# Patient Record
Sex: Male | Born: 1995 | Race: White | Hispanic: No | Marital: Single | Smoking: Current every day smoker
Health system: Southern US, Community
[De-identification: ages and names within clinical notes are randomized; demographics above are authoritative.]

## PROBLEM LIST (undated history)

## (undated) DIAGNOSIS — S99912A Unspecified injury of left ankle, initial encounter: Secondary | ICD-10-CM

## (undated) DIAGNOSIS — F1990 Other psychoactive substance use, unspecified, uncomplicated: Secondary | ICD-10-CM

---
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
## 2017-05-23 ENCOUNTER — Emergency Department
Admission: EM | Admit: 2017-05-23 | Discharge: 2017-05-23 | Disposition: A | Discharge status: Home / Self Care | Payer: Self-pay | Attending: Emergency Medicine | Admitting: Emergency Medicine

## 2017-05-23 ENCOUNTER — Other Ambulatory Visit: Payer: Self-pay

## 2017-05-23 DIAGNOSIS — K0889 Other specified disorders of teeth and supporting structures: Secondary | ICD-10-CM | POA: Insufficient documentation

## 2017-05-23 MED ORDER — TRAMADOL HCL 50 MG PO TABS: 50.0000 mg | ORAL_TABLET | Freq: Four times a day (QID) | ORAL | 0 refills | Status: DC | PRN

## 2017-05-23 MED ORDER — LIDOCAINE VISCOUS 2 % MT SOLN
15.0000 mL | Freq: Once | OROMUCOSAL | Status: AC
  Administered 2017-05-23: 15 mL via OROMUCOSAL
  Filled 2017-05-23: qty 15

## 2017-05-23 MED ORDER — AMOXICILLIN 500 MG PO CAPS: 500.0000 mg | ORAL_CAPSULE | Freq: Three times a day (TID) | ORAL | 0 refills | Status: DC

## 2017-05-23 MED ORDER — IBUPROFEN 800 MG PO TABS: 800.0000 mg | ORAL_TABLET | Freq: Three times a day (TID) | ORAL | 0 refills | Status: DC | PRN

## 2017-05-23 MED ORDER — DIPHENHYDRAMINE HCL 12.5 MG/5ML PO ELIX
25.0000 mg | ORAL_SOLUTION | Freq: Once | ORAL | Status: AC
  Administered 2017-05-23: 25 mg via ORAL
  Filled 2017-05-23: qty 10

## 2017-05-23 MED ORDER — LIDOCAINE VISCOUS 2 % MT SOLN: 5.0000 mL | Freq: Four times a day (QID) | OROMUCOSAL | 0 refills | Status: DC | PRN

## 2017-05-23 NOTE — ED Provider Notes (Signed)
[facility] Emergency Department Provider Note   ____________________________________________   First MD Initiated Contact with Patient 05/23/17 [phone]     (approximate)  I have reviewed the triage vital signs and the nursing notes.   HISTORY  Chief Complaint Dental Pain    HPI Jeffrey Patel is a 21 y.o. male patient presented with left-sided dental pain for 3 days. Patient has history of devitalized teeth. Patient has no dental follow-up in over 2 years. Patient rates pain as a 9/10. Patient describes pain as "achy". Patient denies fever associated this complaint. Patient denies  edema. No palliative measures for complaint.   Past Medical History:  Diagnosis Date  . Left ankle injury     There are no active problems to display for this patient.   No past surgical history on file.  Prior to Admission medications   Medication Sig Start Date End Date Taking? Authorizing Provider  amoxicillin (AMOXIL) 500 MG capsule Take 1 capsule (500 mg total) by mouth 3 (three) times daily. 05/23/17   Joni ReiningSmith, Ronald K, PA-C  ibuprofen (ADVIL,MOTRIN) 800 MG tablet Take 1 tablet (800 mg total) by mouth every 8 (eight) hours as needed. 05/23/17   Joni ReiningSmith, Ronald K, PA-C  lidocaine (XYLOCAINE) 2 % solution Use as directed 5 mLs in the mouth or throat every 6 (six) hours as needed for mouth pain. Oral swish 05/23/17   Joni ReiningSmith, Ronald K, PA-C  traMADol (ULTRAM) 50 MG tablet Take 1 tablet (50 mg total) by mouth every 6 (six) hours as needed. 05/23/17 05/23/18  Joni ReiningSmith, Ronald K, PA-C    Allergies Patient has no known allergies.  No family history on file.  Social History Social History   Tobacco Use  . Smoking status: Not on file  Substance Use Topics  . Alcohol use: Not on file  . Drug use: Not on file    Review of Systems  Constitutional: No fever/chills Eyes: No visual changes. ENT: No sore throat. Cardiovascular: Denies chest pain. Respiratory: Denies  shortness of breath. Gastrointestinal: No abdominal pain.  No nausea, no vomiting.  No diarrhea.  No constipation. Genitourinary: Negative for dysuria. Musculoskeletal: Negative for back pain. Skin: Negative for rash. Neurological: Negative for headaches, focal weakness or numbness.  ____________________________________________   PHYSICAL EXAM:  VITAL SIGNS: ED Triage Vitals  Enc Vitals Group     BP 05/23/17 0855 (!) 127/54     Pulse Rate 05/23/17 0855 64     Resp --      Temp 05/23/17 0855 (!) 97.5 F (36.4 C)     Temp Source 05/23/17 0855 Oral     SpO2 05/23/17 0855 100 %     Weight 05/23/17 0855 160 lb (72.6 kg)     Height 05/23/17 0855 5\' 11"  (1.803 m)     Head Circumference --      Peak Flow --      Pain Score 05/23/17 0851 9     Pain Loc --      Pain Edu? --      Excl. in GC? --    Constitutional: Alert and oriented. Well appearing and in no acute distress. Eyes: Conjunctivae are normal. PERRL. Mouth/Throat: Mucous membranes are moist.  Oropharynx non-erythematous. Multiple fracture and caries throughout oral cavity. Neck: No stridor.  Hematological/Lymphatic/Immunilogical: No cervical lymphadenopathy. Cardiovascular: Normal rate, regular rhythm. Grossly normal heart sounds.  Good peripheral circulation. Respiratory: Normal respiratory effort.  No retractions. Lungs CTAB. Neurologic:  Normal speech and language. No gross  focal neurologic deficits are appreciated. No gait instability. Skin:  Skin is warm, dry and intact. No rash noted. Psychiatric: Mood and affect are normal. Speech and behavior are normal.  ____________________________________________   LABS (all labs ordered are listed, but only abnormal results are displayed)  Labs Reviewed - No data to display ____________________________________________  EKG   ____________________________________________  RADIOLOGY  No results  found.  ____________________________________________   PROCEDURES  Procedure(s) performed: None  Procedures  Critical Care performed: No  ____________________________________________   INITIAL IMPRESSION / ASSESSMENT AND PLAN / ED COURSE  As part of my medical decision making, I reviewed the following data within the electronic MEDICAL RECORD NUMBER Notes from prior ED visits and [place] Controlled Substance Database   Dental pain secondary to multiple fracture teeth and caries. Patient given discharge care instructions. Patient has taken medication as directed. Patient advised follow-up for list of dental clinics provided before discharge.      ____________________________________________   FINAL CLINICAL IMPRESSION(S) / ED DIAGNOSES  Final diagnoses:  Dentalgia     ED Discharge Orders        Ordered    amoxicillin (AMOXIL) 500 MG capsule  3 times daily     05/23/17 0952    lidocaine (XYLOCAINE) 2 % solution  Every 6 hours PRN     05/23/17 0952    traMADol (ULTRAM) 50 MG tablet  Every 6 hours PRN     05/23/17 0952    ibuprofen (ADVIL,MOTRIN) 800 MG tablet  Every 8 hours PRN     05/23/17 [id number]       Note:  This document was prepared using Dragon voice recognition software and may include unintentional dictation errors.    Joni ReiningSmith, Ronald K, PA-C 05/23/17 1002    Jene EveryKinner, Robert, MD 05/23/17 [phone]

## 2017-05-23 NOTE — ED Notes (Signed)
See triage note Presents with  Toothache for about 3 days  States he thinks he may have broken a tooth  Min jaw swelling noted

## 2017-05-23 NOTE — Discharge Instructions (Signed)
Follow-up from list of dental clinics. °OPTIONS FOR DENTAL FOLLOW UP CARE ° °[place] [facility] and Human [facility] - Local Safety Net Dental Clinics °http://www.ncdhhs.gov/dph/oralhealth/services/safetynetclinics.htm °  °[facility] (336-562-3123) ° °[facility] (919-933-9087) ° °[facility] ([phone] ext [phone]) ° °[facility] ([phone]) ° °[facility] ([phone]) °This clinic caters to the indigent population and is on a lottery system. °Location: °[facility], [address], [place] °Clinic Hours: °Wednesdays from 6pm - 9pm, patients seen by a lottery system. °For dates, call or go to [email or web address].[email or web address]/[facility]/patients/Dental-[facility] °Services: °Cleanings, fillings and simple extractions. °Payment Options: °DENTAL WORK IS FREE OF CHARGE. Bring proof of income or support. °Best way to get seen: °Arrive at 5:15 pm - this is a lottery, NOT first come/first serve, so arriving earlier will not increase your chances of being seen. °  °  °[facility] °[phone] °Select option 1 for emergencies °  °Location: °[facility], [address], [place] °Clinic Hours: °No walk-ins accepted - call the day before to schedule an appointment. °Check in times are 9:30 am and 1:30 pm. °Services: °Simple extractions, temporary fillings, pulpectomy/pulp debridement, uncomplicated abscess drainage. °Payment Options: °PAYMENT IS DUE AT THE TIME OF SERVICE.  Fee is usually $100-200, additional surgical procedures (e.g. abscess drainage) may be extra. °Cash, checks, Visa/MasterCard accepted.  Can file Medicaid if patient is covered for dental - patient should call case worker to check. °No discount for [facility] patients. °Best way to get seen: °MUST call the day before and get onto the schedule. Can usually be seen the next 1-2 days. No walk-ins accepted. °  °  °[facility] °[phone] °  °Location: °[facility], [address], [place] °Clinic Hours: °M, W, Th, F 8am or 1:30pm, Tues 9a or 1:30 - first come/first served. °Services: °Simple extractions, temporary fillings, uncomplicated abscess drainage.  You do not need to be an [place] resident. °Payment Options: °PAYMENT IS DUE AT THE TIME OF SERVICE. °Dental insurance, otherwise sliding scale - bring proof of income or support. °Depending on income and treatment needed, cost is usually $50-200. °Best way to get seen: °Arrive early as it is first come/first served. °  °  °[facility] °[phone] °  °Location: °[address] °Clinic Hours: °Mon-Thu 8a-5p °Services: °Most basic dental services including extractions and fillings. °Payment Options: °PAYMENT IS DUE AT THE TIME OF SERVICE. °Sliding scale, up to 50% off - bring proof if income or support. °Medicaid with dental option accepted. °Best way to get seen: °Call to schedule an appointment, can usually be seen within 2 weeks OR they will try to see walk-ins - show up at 8a or 2p (you may have to wait). °  °  °[facility] °[phone] °[place] RESIDENTS ONLY °  °Location: °[facility], [address], [place], [place] [address] °Clinic Hours: By appointment only. °Monday - Thursday 8am-5pm, Friday 8am-12pm °Services: Cleanings, fillings, extractions. °Payment Options: °PAYMENT IS DUE AT THE TIME OF SERVICE. °Cash, Visa or MasterCard. Sliding scale - $30 minimum per service. °Best way to get seen: °Come in to office, complete packet and make an appointment - need proof of income °or support monies for each household member and proof of [place] residence. °Usually takes about a month to get in. °  °  °[facility] °[phone] °  °  Location: °[address] [place] °Clinic Hours: Walk-in Urgent Care Dental Services  are offered Monday-Friday mornings only. °The numbers of emergencies accepted daily is limited to the number of °providers available. °Maximum 15 - Mondays, Wednesdays & Thursdays °Maximum 10 - Tuesdays & Fridays °Services: °You do not need to be a [place] resident to be seen for a dental emergency. °Emergencies are defined as pain, swelling, abnormal bleeding, or dental trauma. Walkins will receive x-rays if needed. °NOTE: Dental cleaning is not an emergency. °Payment Options: °PAYMENT IS DUE AT THE TIME OF SERVICE. °Minimum co-pay is $40.00 for uninsured patients. °Minimum co-pay is $3.00 for Medicaid with dental coverage. °Dental Insurance is accepted and must be presented at time of visit. °Medicare does not cover dental. °Forms of payment: Cash, credit card, checks. °Best way to get seen: °If not previously registered with the clinic, walk-in dental registration begins at 7:15 am and is on a first come/first serve basis. °If previously registered with the clinic, call to make an appointment. °  °  °[facility] °[phone] °[place] RESIDENTS ONLY °  °Location: °[address], [place], [place] °Clinic Hours: °Mon-Thu 10a-2p °Services: Extractions only! °Payment Options: °FREE (donations accepted) - bring proof of income or support °Best way to get seen: °Call and schedule an appointment OR come at 8am on the 1st Monday of every month (except for holidays) when it is first come/first served. °  °  °[facility] °[phone] °  °Location: °[address], [place] °Clinic Hours: °Friday mornings °Services, Payment Options, Best way to get seen: °Call for info ° °

## 2017-05-23 NOTE — ED Triage Notes (Signed)
Pt c/o left sided dental pain for the past 3 days

## 2017-09-19 ENCOUNTER — Emergency Department
Admission: EM | Admit: 2017-09-19 | Discharge: 2017-09-19 | Disposition: A | Discharge status: Home / Self Care | Payer: Self-pay | Attending: Emergency Medicine | Admitting: Emergency Medicine

## 2017-09-19 ENCOUNTER — Encounter: Payer: Self-pay | Admitting: Emergency Medicine

## 2017-09-19 ENCOUNTER — Other Ambulatory Visit: Payer: Self-pay

## 2017-09-19 DIAGNOSIS — K047 Periapical abscess without sinus: Secondary | ICD-10-CM | POA: Insufficient documentation

## 2017-09-19 DIAGNOSIS — F17200 Nicotine dependence, unspecified, uncomplicated: Secondary | ICD-10-CM | POA: Insufficient documentation

## 2017-09-19 MED ORDER — IBUPROFEN 600 MG PO TABS: 600.0000 mg | ORAL_TABLET | Freq: Three times a day (TID) | ORAL | 0 refills | Status: DC | PRN

## 2017-09-19 MED ORDER — PENICILLIN V POTASSIUM 500 MG PO TABS: 500.0000 mg | ORAL_TABLET | Freq: Four times a day (QID) | ORAL | 0 refills | Status: DC

## 2017-09-19 MED ORDER — CEFTRIAXONE SODIUM 1 G IJ SOLR
1.0000 g | Freq: Once | INTRAMUSCULAR | Status: AC
  Administered 2017-09-19: 1 g via INTRAMUSCULAR
  Filled 2017-09-19: qty 10

## 2017-09-19 MED ORDER — LIDOCAINE HCL (PF) 1 % IJ SOLN
5.0000 mL | Freq: Once | INTRAMUSCULAR | Status: AC
  Administered 2017-09-19: 5 mL
  Filled 2017-09-19: qty 5

## 2017-09-19 NOTE — ED Notes (Signed)
See triage note  Presents with dental pain and some swelling noted to gumline  No fever

## 2017-09-19 NOTE — ED Triage Notes (Signed)
Has had tooth pain.  This am woke up with swelling to left side of jaw.

## 2017-09-19 NOTE — ED Provider Notes (Signed)
[facility] Emergency Department Provider Note  ____________________________________________   First MD Initiated Contact with Patient 09/19/17 [phone]     (approximate)  I have reviewed the triage vital signs and the nursing notes.   HISTORY  Chief Complaint Dental Pain and Oral Swelling   HPI Jeffrey Patel is a 22 y.o. male is here with complaint of left-sided dental pain that has been going on for over a week however this morning when he woke up he had swelling to the left side of his jaw.  Patient denies any fever or chills.  There is been no nausea or vomiting.  Patient states he does not have a dentist.  He continues to smoke daily.  He has taken over-the-counter medication without any relief.   Past Medical History:  Diagnosis Date  . Left ankle injury     There are no active problems to display for this patient.   History reviewed. No pertinent surgical history.  Prior to Admission medications   Medication Sig Start Date End Date Taking? Authorizing Provider  ibuprofen (ADVIL,MOTRIN) 600 MG tablet Take 1 tablet (600 mg total) by mouth every 8 (eight) hours as needed. 09/19/17   Tommi Rumps, PA-C  penicillin v potassium (VEETID) 500 MG tablet Take 1 tablet (500 mg total) by mouth 4 (four) times daily. 09/19/17   Tommi Rumps, PA-C    Allergies Patient has no known allergies.  No family history on file.  Social History Social History   Tobacco Use  . Smoking status: Current Every Day Smoker  . Smokeless tobacco: Never Used  Substance Use Topics  . Alcohol use: Yes  . Drug use: Not on file    Review of Systems Constitutional: No fever/chills Eyes: No visual changes. ENT: No sore throat.  Positive for dental pain. Cardiovascular: Denies chest pain. Respiratory: Denies shortness of breath. Musculoskeletal: Negative for muscle aches. Neurological: Negative for  headaches ___________________________________________   PHYSICAL EXAM:  VITAL SIGNS: ED Triage Vitals  Enc Vitals Group     BP 09/19/17 0943 103/66     Pulse Rate 09/19/17 0943 85     Resp 09/19/17 0943 15     Temp 09/19/17 0943 98.8 F (37.1 C)     Temp Source 09/19/17 0943 Oral     SpO2 09/19/17 0943 98 %     Weight 09/19/17 0933 160 lb (72.6 kg)     Height 09/19/17 0933 5\' 11"  (1.803 m)     Head Circumference --      Peak Flow --      Pain Score 09/19/17 0932 10     Pain Loc --      Pain Edu? --      Excl. in GC? --    Constitutional: Alert and oriented. Well appearing and in no acute distress. Eyes: Conjunctivae are normal. PERRL. EOMI. Head: Atraumatic. Nose: No congestion/rhinnorhea. Mouth/Throat: Mucous membranes are moist.  Oropharynx non-erythematous.  Left lower molar is in poor repair.  There is marked tenderness on palpation of the mandible adjacent to this particular tooth.  No active drainage or abscess is seen.  There is moderate amount of soft tissue swelling noted exteriorly.  No swelling under the tongue is noted.  Patient is able to speak without any difficulty. Neck: No stridor.   Hematological/Lymphatic/Immunilogical: No cervical lymphadenopathy. Cardiovascular: Normal rate, regular rhythm. Grossly normal heart sounds.  Good peripheral circulation. Respiratory: Normal respiratory effort.  No retractions. Lungs CTAB. Musculoskeletal: No lower extremity  tenderness nor edema.  No joint effusions. Neurologic:  Normal speech and language. No gross focal neurologic deficits are appreciated.  Skin:  Skin is warm, dry and intact. Psychiatric: Mood and affect are normal. Speech and behavior are normal.  ____________________________________________   LABS (all labs ordered are listed, but only abnormal results are displayed)  Labs Reviewed - No data to display  PROCEDURES  Procedure(s) performed: None  Procedures  Critical Care performed:  No  ____________________________________________   INITIAL IMPRESSION / ASSESSMENT AND PLAN / ED COURSE  As part of my medical decision making, I reviewed the following data within the electronic MEDICAL RECORD NUMBER Notes from prior ED visits and [place] Controlled Substance Database  Patient was given Rocephin 1 g IM while in the department.  He was discharged with a prescription for Pen-Vee K 500 mg 4 times daily for 7 days.  Patient is encouraged not to smoke during this time.  He was given a list of dental clinics for him to see including the walk-in clinics.  ____________________________________________   FINAL CLINICAL IMPRESSION(S) / ED DIAGNOSES  Final diagnoses:  Dental abscess     ED Discharge Orders        Ordered    penicillin v potassium (VEETID) 500 MG tablet  4 times daily     09/19/17 1029    ibuprofen (ADVIL,MOTRIN) 600 MG tablet  Every 8 hours PRN     09/19/17 1029       Note:  This document was prepared using Dragon voice recognition software and may include unintentional dictation errors.    Tommi RumpsSummers, Rhonda L, PA-C 09/19/17 1036    Sharman CheekStafford, Phillip, MD 09/19/17 [phone]

## 2017-09-19 NOTE — Discharge Instructions (Signed)
A list of dental clinics as listed on your discharge papers.  Follow-up with 1 of these.  Also [facility] in both [place] and [place] take walk ins.  This is listed on a separate paper. He had taking antibiotics as directed along with ibuprofen as needed for pain.  OPTIONS FOR DENTAL FOLLOW UP CARE  [place] Department of Health and Human Services - Local Safety Net Dental Clinics [email or web address]://www.ncdhhs.gov/dph/oralhealth/services/safetynetclinics.htm   [facility] [phone])  [facility] [place] [phone])  [facility] [place] [phone])  [facility] [phone])  [facility] [phone]) This clinic caters to the indigent population and is on a lottery system. Location: [employer], [address], [address], [place] Clinic Hours: Wednesdays from 6pm - 9pm, patients seen by a lottery system. For dates, call or go to [email or web address] Services: Cleanings, fillings and simple extractions. Payment Options: DENTAL WORK IS FREE OF CHARGE. Bring proof of income or support. Best way to get seen: Arrive at 5:15 pm - this is a lottery, NOT first come/first serve, so arriving earlier will not increase your chances of being seen.     [facility] [phone] Select [address] 1 for emergencies   Location: [facility], [place], [address], [place] Clinic Hours: No walk-ins accepted - call the day before to schedule an appointment. Check in times are 9:30 am and 1:30 pm. Services: Simple extractions, temporary fillings, pulpectomy/pulp debridement, uncomplicated abscess drainage. Payment Options: PAYMENT IS DUE AT THE TIME OF SERVICE.  Fee is usually $100-200, additional surgical procedures (e.g. abscess drainage) may be extra. Cash, checks, Visa/MasterCard accepted.  Can file Medicaid if patient is covered for  dental - patient should call case worker to check. No discount for [facility] patients. Best way to get seen: MUST call the day before and get onto the schedule. Can usually be seen the next 1-2 days. No walk-ins accepted.     [facility] [phone]   Location: [facility], [address], [place] Clinic Hours: M, W, Th, F 8am or 1:30pm, Tues 9a or 1:30 - first come/first served. Services: Simple extractions, temporary fillings, uncomplicated abscess drainage.  You do not need to be an [place] resident. Payment Options: PAYMENT IS DUE AT THE TIME OF SERVICE. Dental insurance, otherwise sliding scale - bring proof of income or support. Depending on income and treatment needed, cost is usually $50-200. Best way to get seen: Arrive early as it is first come/first served.     [facility] [phone]   Location: [address] Clinic Hours: Mon-Thu 8a-5p Services: Most basic dental services including extractions and fillings. Payment Options: PAYMENT IS DUE AT THE TIME OF SERVICE. Sliding scale, up to 50% off - bring proof if income or support. Medicaid with dental option accepted. Best way to get seen: Call to schedule an appointment, can usually be seen within 2 weeks OR they will try to see walk-ins - show up at 8a or 2p (you may have to wait).     [facility] [phone] [place] RESIDENTS ONLY   Location: [facility], [address], [place], [place] [id number] Clinic Hours: By appointment only. Monday - Thursday 8am-5pm, Friday 8am-12pm Services: Cleanings, fillings, extractions. Payment Options: PAYMENT IS DUE AT THE TIME OF SERVICE. Cash, Visa or MasterCard. Sliding scale - $30 minimum per service. Best way to  get seen: Come in to office, complete packet and make an appointment - need proof of income or support monies for each  household member and proof of [facility] residence. Usually takes about a month to get in.     [facility] [phone]   Location: [address], [place] Clinic Hours: Walk-in Urgent Care Dental Services are offered Monday-Friday mornings only. The numbers of emergencies accepted daily is limited to the number of providers available. Maximum 15 - Mondays, Wednesdays & Thursdays Maximum 10 - Tuesdays & Fridays Services: You do not need to be a [facility] resident to be seen for a dental emergency. Emergencies are defined as pain, swelling, abnormal bleeding, or dental trauma. Walkins will receive x-rays if needed. NOTE: Dental cleaning is not an emergency. Payment Options: PAYMENT IS DUE AT THE TIME OF SERVICE. Minimum co-pay is $40.00 for uninsured patients. Minimum co-pay is $3.00 for Medicaid with dental coverage. Dental Insurance is accepted and must be presented at time of visit. Medicare does not cover dental. Forms of payment: Cash, credit card, checks. Best way to get seen: If not previously registered with the clinic, walk-in dental registration begins at 7:15 am and is on a first come/first serve basis. If previously registered with the clinic, call to make an appointment.     [facility] [place] ONLY   Location: [address] [place], [place] Clinic Hours: Mon-Thu 10a-2p Services: Extractions only! Payment Options: FREE (donations accepted) - bring proof of income or support Best way to get seen: Call and schedule an appointment OR come at 8am on the 1st Monday of every month (except for holidays) when it is first come/first served.     [facility] [phone]   Location: [place], [place] Clinic Hours: Friday mornings Services, Payment Options, Best way to get seen: Call for info

## 2019-08-26 ENCOUNTER — Encounter: Payer: Self-pay | Admitting: Emergency Medicine

## 2019-08-26 ENCOUNTER — Other Ambulatory Visit: Payer: Self-pay

## 2019-08-26 ENCOUNTER — Emergency Department
Admission: EM | Admit: 2019-08-26 | Discharge: 2019-08-26 | Disposition: A | Discharge status: Home / Self Care | Payer: Self-pay | Attending: Emergency Medicine | Admitting: Emergency Medicine

## 2019-08-26 DIAGNOSIS — F1120 Opioid dependence, uncomplicated: Secondary | ICD-10-CM | POA: Insufficient documentation

## 2019-08-26 DIAGNOSIS — Z79899 Other long term (current) drug therapy: Secondary | ICD-10-CM | POA: Insufficient documentation

## 2019-08-26 DIAGNOSIS — F1910 Other psychoactive substance abuse, uncomplicated: Secondary | ICD-10-CM

## 2019-08-26 DIAGNOSIS — F1520 Other stimulant dependence, uncomplicated: Secondary | ICD-10-CM | POA: Insufficient documentation

## 2019-08-26 DIAGNOSIS — F17210 Nicotine dependence, cigarettes, uncomplicated: Secondary | ICD-10-CM | POA: Insufficient documentation

## 2019-08-26 LAB — URINE DRUG SCREEN, QUALITATIVE (ARMC ONLY)
Amphetamines, Ur Screen: POSITIVE — AB
Barbiturates, Ur Screen: NOT DETECTED
Benzodiazepine, Ur Scrn: NOT DETECTED
Cannabinoid 50 Ng, Ur ~~LOC~~: POSITIVE — AB
Cocaine Metabolite,Ur ~~LOC~~: NOT DETECTED
MDMA (Ecstasy)Ur Screen: NOT DETECTED
Methadone Scn, Ur: NOT DETECTED
Opiate, Ur Screen: NOT DETECTED
Phencyclidine (PCP) Ur S: NOT DETECTED
Tricyclic, Ur Screen: NOT DETECTED

## 2019-08-26 LAB — CBC
HCT: 44.4 % (ref 39.0–52.0)
Hemoglobin: 15 g/dL (ref 13.0–17.0)
MCH: 32.2 pg (ref 26.0–34.0)
MCHC: 33.8 g/dL (ref 30.0–36.0)
MCV: 95.3 fL (ref 80.0–100.0)
Platelets: 249 10*3/uL (ref 150–400)
RBC: 4.66 MIL/uL (ref 4.22–5.81)
RDW: 12.3 % (ref 11.5–15.5)
WBC: 8.2 10*3/uL (ref 4.0–10.5)
nRBC: 0 % (ref 0.0–0.2)

## 2019-08-26 LAB — COMPREHENSIVE METABOLIC PANEL
ALT: 25 U/L (ref 0–44)
AST: 28 U/L (ref 15–41)
Albumin: 4.4 g/dL (ref 3.5–5.0)
Alkaline Phosphatase: 67 U/L (ref 38–126)
Anion gap: 8 (ref 5–15)
BUN: 9 mg/dL (ref 6–20)
CO2: 27 mmol/L (ref 22–32)
Calcium: 9.3 mg/dL (ref 8.9–10.3)
Chloride: 105 mmol/L (ref 98–111)
Creatinine, Ser: 1.08 mg/dL (ref 0.61–1.24)
GFR calc Af Amer: >60 mL/min (ref >60)
GFR calc non Af Amer: >60 mL/min (ref >60)
Glucose, Bld: 73 mg/dL (ref 70–99)
Potassium: 3.5 mmol/L (ref 3.5–5.1)
Sodium: 140 mmol/L (ref 135–145)
Total Bilirubin: 0.9 mg/dL (ref 0.3–1.2)
Total Protein: 7.7 g/dL (ref 6.5–8.1)

## 2019-08-26 LAB — CK: Total CK: 215 U/L (ref 49–397)

## 2019-08-26 LAB — ETHANOL: Alcohol, Ethyl (B): <10 mg/dL (ref <10)

## 2019-08-26 MED ORDER — SODIUM CHLORIDE 0.9 % IV BOLUS
1000.0000 mL | Freq: Once | INTRAVENOUS | Status: AC
  Administered 2019-08-26: 06:00:00 1000 mL via INTRAVENOUS

## 2019-08-26 NOTE — ED Notes (Signed)
Pt asleep, RR even and unlabored. VSS. NAD noted at this time. This Rn will continue to monitor pt.

## 2019-08-26 NOTE — ED Provider Notes (Signed)
[facility] Emergency Department Provider Note  ____________________________________________   First MD Initiated Contact with Patient 08/26/19 [phone]     (approximate)  I have reviewed the triage vital signs and the nursing notes.   HISTORY  Chief Complaint Dizziness and Addiction Problem   HPI Jeffrey Patel is a 24 y.o. male presents to the emergency department secondary to feeling lightheaded and dizzy after using methamphetamines and "some other stuff" tonight.  Patient is currently requesting detox from multiple illicit drugs.          Past Medical History:  Diagnosis Date  . Left ankle injury     There are no problems to display for this patient.   History reviewed. No pertinent surgical history.  Prior to Admission medications   Medication Sig Start Date End Date Taking? Authorizing Provider  ibuprofen (ADVIL,MOTRIN) 600 MG tablet Take 1 tablet (600 mg total) by mouth every 8 (eight) hours as needed. 09/19/17   Johnn Hai, PA-C  penicillin v potassium (VEETID) 500 MG tablet Take 1 tablet (500 mg total) by mouth 4 (four) times daily. 09/19/17   Johnn Hai, PA-C    Allergies Patient has no known allergies.  No family history on file.  Social History Social History   Tobacco Use  . Smoking status: Current Every Day Smoker  . Smokeless tobacco: Never Used  Substance Use Topics  . Alcohol use: Yes  . Drug use: Yes    Types: Methamphetamines    Review of Systems Constitutional: No fever/chills Eyes: No visual changes. ENT: No sore throat. Cardiovascular: Denies chest pain. Respiratory: Denies shortness of breath. Gastrointestinal: No abdominal pain.  No nausea, no vomiting.  No diarrhea.  No constipation. Genitourinary: Negative for dysuria. Musculoskeletal: Negative for neck pain.  Negative for back pain. Integumentary: Negative for rash. Neurological: Negative for headaches, focal weakness or  numbness.  ____________________________________________   PHYSICAL EXAM:  VITAL SIGNS: ED Triage Vitals  Enc Vitals Group     BP 08/26/19 0557 (!) 150/97     Pulse Rate 08/26/19 0557 77     Resp 08/26/19 0557 18     Temp 08/26/19 0557 98.4 F (36.9 C)     Temp Source 08/26/19 0557 Oral     SpO2 08/26/19 0557 100 %     Weight 08/26/19 0558 68 kg (150 lb)     Height 08/26/19 0558 1.803 m (5\' 11" )     Head Circumference --      Peak Flow --      Pain Score 08/26/19 0558 0     Pain Loc --      Pain Edu? --      Excl. in [place]? --     Constitutional: Alert and oriented.  Appears intoxicated Eyes: Conjunctivae are normal.  Mouth/Throat: Dry oral mucosa. Neck: No stridor.  No meningeal signs.   Cardiovascular: Normal rate, regular rhythm. Good peripheral circulation. Grossly normal heart sounds. Respiratory: Normal respiratory effort.  No retractions. Gastrointestinal: Soft and nontender. No distention.  Musculoskeletal: No lower extremity tenderness nor edema. No gross deformities of extremities. Neurologic:  Normal speech and language. No gross focal neurologic deficits are appreciated.  Skin:  Skin is warm, dry and intact. Psychiatric: Bizarre affect speech and behavior are normal.  ____________________________________________   LABS (all labs ordered are listed, but only abnormal results are displayed)  Labs Reviewed  CBC  COMPREHENSIVE METABOLIC PANEL  ETHANOL  CK  URINE DRUG SCREEN, QUALITATIVE ([place])  Procedures   ____________________________________________   INITIAL IMPRESSION / MDM / ASSESSMENT AND PLAN / ED COURSE  As part of my medical decision making, I reviewed the following data within the electronic MEDICAL RECORD NUMBER 24 year old male presented with above-stated history and physical exam requesting detox from multiple illicit drugs.  Awaiting TTS and psychiatry consultation and disposition ____________________________________________  FINAL  CLINICAL IMPRESSION(S) / ED DIAGNOSES  Final diagnoses:  Polysubstance abuse (HCC)     MEDICATIONS GIVEN DURING THIS VISIT:  Medications - No data to display   ED Discharge Orders    None      *Please note:  Jeffrey Patel was evaluated in Emergency Department on 08/26/2019 for the symptoms described in the history of present illness. He was evaluated in the context of the global COVID-19 pandemic, which necessitated consideration that the patient might be at risk for infection with the SARS-CoV-2 virus that causes COVID-19. Institutional protocols and algorithms that pertain to the evaluation of patients at risk for COVID-19 are in a state of rapid change based on information released by regulatory bodies including the CDC and federal and state organizations. These policies and algorithms were followed during the patient's care in the ED.  Some ED evaluations and interventions may be delayed as a result of limited staffing during the pandemic.*  Note:  This document was prepared using Dragon voice recognition software and may include unintentional dictation errors.   Darci Current, MD 08/26/19 [phone]

## 2019-08-26 NOTE — ED Notes (Signed)
EDP Paduchowski at bedside.  Pt ambulatory to toilet w/a steady gait.

## 2019-08-26 NOTE — ED Notes (Signed)
Pt reporting he would also like MD to assess a possible abscess in his mouth and a pain in his right side under him arm. Pt pointed to right upper ribs when asked and reports pain worsens with movement. No known injury.

## 2019-08-26 NOTE — ED Notes (Addendum)
Pt provided with phone to call fiance. TTS computer at bedside this time.

## 2019-08-26 NOTE — Discharge Instructions (Addendum)
[facility]  [address] [place], [place] [address] [phone]

## 2019-08-26 NOTE — BH Assessment (Signed)
Tele Assessment Note   Patient Name: Jeffrey Patel MRN: [id number] Referring Physician: Lenard Lance Location of Patient: [facility] Location of Provider: Behavioral Health TTS Department  RAJENDRA SPILLER is an 24 y.o. male who presented to [facility] seeking help for his heroin and methamphetamine problem.  He states that he has been using $30 to 40 worth daily of each of the drugs and states that he has been unable to stop using on his won and is seeking detox and residential services in order to get some help with his addiction. Patient states that his girlfriend kicked him out and told him not to come back until he gets some help.  Patient states, "I don't want to lose her or my kids."  Patient states that he has never had any kind of SA or MH treatment in the past.  Patient denies SI/HI/Psychosis.  He states that he has only been sleeping approximately 3 hours per night.  Patient states that he has not been eating well and states that he has lost weight, but not sure how much. Patient denies any history of self-mutilation or abuse.  TTS made arrangements for patient to get a detox bed at [facility] at [address] in [place], [place] [address] [phone].  TTS spoke to patient's girlfriend who agreed to transport him to the facility.  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 Left ankle injury     History reviewed. No pertinent surgical history.  Family History: No family history on file.  Social History:  reports that he has been smoking. He has never used smokeless tobacco. He reports current alcohol use. He reports current drug use. Drugs: Methamphetamines and Heroin.  Additional Social History:  Alcohol / Drug Use Pain Medications: see  MAR Prescriptions: see MAR Over the Counter: see MAR History of alcohol / drug use?: Yes Longest period of  sobriety (when/how long): none Negative Consequences of Use: Financial, Legal, Personal relationships, Work / School  CIWA: CIWA-Ar BP: 116/60 Pulse Rate: 60 COWS:    Allergies: No Known Allergies  Home Medications: (Not in a hospital admission)   OB/GYN Status:  No LMP for male patient.  General Assessment Data Location of Assessment: [facility] ED TTS Assessment: In system Is this a Tele or Face-to-Face Assessment?: Tele Assessment Is this an Initial Assessment or a Re-assessment for this encounter?: Initial Assessment Patient Accompanied by:: N/A Language Other than English: No Living Arrangements: Other (Comment)(lives with fiance) What gender do you identify as?: Male Marital status: Single Living Arrangements: Spouse/significant other Can pt return to current living arrangement?: Yes Admission Status: Voluntary Is patient capable of signing voluntary admission?: Yes Referral Source: Self/Family/Friend Insurance type: self-pay(self-pay)     Crisis Care Plan Living Arrangements: Spouse/significant other Legal Guardian: Other:(self) Name of Psychiatrist: none Name of Therapist: none  Education Status Is patient currently in school?: No Is the patient employed, unemployed or receiving disability?: Unemployed  Risk to self with the past 6 months Suicidal Ideation: No Has patient been a risk to self within the past 6 months prior to admission? : No Suicidal Intent: No Has patient had any suicidal intent within the past 6 months prior to admission? : No Is patient at risk for suicide?: No Suicidal Plan?: No Has patient had any suicidal plan within the past 6 months prior to admission? : No Access to Means: No What has been  your use of drugs/alcohol within the last 12 months?: none Previous Attempts/Gestures: No How many times?: 0 Other Self Harm Risks: drug use Triggers for Past Attempts: None known Intentional Self Injurious Behavior: None Family Suicide History:  No Recent stressful life event(s): Other (Comment)(drug problem) Persecutory voices/beliefs?: No Depression: No Substance abuse history and/or treatment for substance abuse?: Yes Suicide prevention information given to non-admitted patients: Not applicable  Risk to Others within the past 6 months Homicidal Ideation: No Does patient have any lifetime risk of violence toward others beyond the six months prior to admission? : No Thoughts of Harm to Others: No Current Homicidal Intent: No Current Homicidal Plan: No Access to Homicidal Means: No Identified Victim: none History of harm to others?: No Assessment of Violence: None Noted Violent Behavior Description: none Does patient have access to weapons?: No Criminal Charges Pending?: No Does patient have a court date: No Is patient on probation?: No  Psychosis Hallucinations: None noted Delusions: None noted  Mental Status Report Appearance/Hygiene: Unremarkable Eye Contact: Good Motor Activity: Unremarkable Speech: Unremarkable Level of Consciousness: Alert Mood: Pleasant Affect: Appropriate to circumstance Anxiety Level: None Thought Processes: Coherent, Relevant Judgement: Partial Orientation: Person, Place, Time, Situation Obsessive Compulsive Thoughts/Behaviors: None  Cognitive Functioning Concentration: Normal Memory: Recent Intact, Remote Intact Is patient IDD: No Insight: Fair Impulse Control: Poor Appetite: Fair Have you had any weight changes? : Loss Amount of the weight change? (lbs): (unsure) Sleep: Decreased Total Hours of Sleep: 3 Vegetative Symptoms: None  ADLScreening [facility] Assessment Services) Patient's cognitive ability adequate to safely complete daily activities?: Yes Patient able to express need for assistance with ADLs?: Yes Independently performs ADLs?: Yes (appropriate for developmental age)  Prior Inpatient Therapy Prior Inpatient Therapy: No  Prior Outpatient Therapy Prior Outpatient  Therapy: No Does patient have an ACCT team?: No Does patient have Intensive In-House Services?  : No Does patient have [facility] services? : No Does patient have P4CC services?: No  ADL Screening (condition at time of admission) Patient's cognitive ability adequate to safely complete daily activities?: Yes Is the patient deaf or have difficulty hearing?: No Does the patient have difficulty seeing, even when wearing glasses/contacts?: No Does the patient have difficulty concentrating, remembering, or making decisions?: No Patient able to express need for assistance with ADLs?: Yes Does the patient have difficulty dressing or bathing?: No Independently performs ADLs?: Yes (appropriate for developmental age) Does the patient have difficulty walking or climbing stairs?: No Weakness of Legs: None Weakness of Arms/Hands: None  Home Assistive Devices/Equipment Home Assistive Devices/Equipment: None  Therapy Consults (therapy consults require a physician order) PT Evaluation Needed: No OT Evalulation Needed: No SLP Evaluation Needed: No Abuse/Neglect Assessment (Assessment to be complete while patient is alone) Abuse/Neglect Assessment Can Be Completed: Yes Physical Abuse: Denies Verbal Abuse: Denies Sexual Abuse: Denies Exploitation of patient/patient's resources: Denies Self-Neglect: Denies Values / Beliefs Cultural Requests During Hospitalization: None Spiritual Requests During Hospitalization: None Consults Spiritual Care Consult Needed: No Transition of Care Team Consult Needed: No Advance Directives (For Healthcare) Does Patient Have a Medical Advance Directive?: No Would patient like information on creating a medical advance directive?: No - Patient declined Nutrition Screen- [facility] Adult/WL/AP Has the patient recently lost weight without trying?: No Has the patient been eating poorly because of a decreased appetite?: No Malnutrition Screening Tool Score: 0         Disposition: Per Hillery Jacks, NP, Patient can be discharged to go to the [facility] Based Crisis Unit in [place] for  admission into detox.  GF to transport  Disposition Initial Assessment Completed for this Encounter: Yes Disposition of Patient: (Admit to [facility])  This service was provided via telemedicine using a 2-way, interactive audio and Radiographer, therapeutic.  Names of all persons participating in this telemedicine service and their role in this encounter. Name: Xhaiden Coombs Role: patient  Name: Kasandra Knudsen Makella Buckingham Role: TTS  Name:  Role:   Name:  Role:     Judeth Porch Fariha Goto 08/26/2019 1:16 PM

## 2019-08-26 NOTE — ED Triage Notes (Signed)
Pt arrived via POV with reports of feeling lightheaded after using meth and "some other stuff"  Pt admits to drinking for the past several days as well.   Pt also asking for detox from drugs as well.  Pt states sxs started about 30 mins ago.

## 2019-08-26 NOTE — ED Provider Notes (Signed)
-----------------------------------------   11:53 AM on 08/26/2019 -----------------------------------------  Patient is awake alert.  I spoke to TTS they have arranged a bed for the patient at [facility] crisis facility in [facility].  Patient's girlfriend is on her way to pick up the patient and take him to the facility.  Patient's medical work-up has been otherwise largely nonrevealing.   Minna Antis, MD 08/26/19 1154

## 2019-08-26 NOTE — ED Notes (Signed)
Pt speaking to TTS from [place] at this time.

## 2019-10-14 ENCOUNTER — Encounter: Payer: Self-pay | Admitting: Emergency Medicine

## 2019-10-14 ENCOUNTER — Emergency Department
Admission: EM | Admit: 2019-10-14 | Discharge: 2019-10-14 | Disposition: A | Discharge status: Home / Self Care | Payer: Self-pay | Attending: Emergency Medicine | Admitting: Emergency Medicine

## 2019-10-14 ENCOUNTER — Other Ambulatory Visit: Payer: Self-pay

## 2019-10-14 DIAGNOSIS — Y93G1 Activity, food preparation and clean up: Secondary | ICD-10-CM | POA: Insufficient documentation

## 2019-10-14 DIAGNOSIS — S61214A Laceration without foreign body of right ring finger without damage to nail, initial encounter: Secondary | ICD-10-CM | POA: Insufficient documentation

## 2019-10-14 DIAGNOSIS — Y929 Unspecified place or not applicable: Secondary | ICD-10-CM | POA: Insufficient documentation

## 2019-10-14 DIAGNOSIS — S61412A Laceration without foreign body of left hand, initial encounter: Secondary | ICD-10-CM | POA: Insufficient documentation

## 2019-10-14 DIAGNOSIS — Z5321 Procedure and treatment not carried out due to patient leaving prior to being seen by health care provider: Secondary | ICD-10-CM | POA: Insufficient documentation

## 2019-10-14 DIAGNOSIS — Y999 Unspecified external cause status: Secondary | ICD-10-CM | POA: Insufficient documentation

## 2019-10-14 DIAGNOSIS — W269XXA Contact with unspecified sharp object(s), initial encounter: Secondary | ICD-10-CM | POA: Insufficient documentation

## 2019-10-14 DIAGNOSIS — F17200 Nicotine dependence, unspecified, uncomplicated: Secondary | ICD-10-CM | POA: Insufficient documentation

## 2019-10-14 DIAGNOSIS — W25XXXA Contact with sharp glass, initial encounter: Secondary | ICD-10-CM | POA: Insufficient documentation

## 2019-10-14 DIAGNOSIS — Y939 Activity, unspecified: Secondary | ICD-10-CM | POA: Insufficient documentation

## 2019-10-14 NOTE — Discharge Instructions (Signed)
Do not get your hand wet for the next 24 hours. Do not soak your hand until the wound has healed. Return to the ER for concern of infection.

## 2019-10-14 NOTE — ED Triage Notes (Signed)
Patient ambulatory to triage with steady gait, without difficulty or distress noted, mask in place; pt reports cut base of rt ring finger washing dishes PTA--no active bleeding; gauze dressing applied

## 2019-10-14 NOTE — ED Provider Notes (Signed)
[facility] Emergency Department Provider Note  ____________________________________________  Time seen: Approximately 1:39 PM  I have reviewed the triage vital signs and the nursing notes.   HISTORY  Chief Complaint Laceration   HPI Jeffrey Patel is a 24 y.o. male presents to the emergency department for treatment and evaluation of laceration to the left hand.  While doing dishes last night around midnight he cut his hand on a broken glass.  He did come here just after the injury but due to the lengthy wait time he left.   Past Medical History:  Diagnosis Date  . Left ankle injury     There are no problems to display for this patient.   History reviewed. No pertinent surgical history.  Prior to Admission medications   Medication Sig Start Date End Date Taking? Authorizing Provider  ibuprofen (ADVIL,MOTRIN) 600 MG tablet Take 1 tablet (600 mg total) by mouth every 8 (eight) hours as needed. 09/19/17   Johnn Hai, PA-C  penicillin v potassium (VEETID) 500 MG tablet Take 1 tablet (500 mg total) by mouth 4 (four) times daily. 09/19/17   Johnn Hai, PA-C    Allergies Patient has no known allergies.  History reviewed. No pertinent family history.  Social History Social History   Tobacco Use  . Smoking status: Current Every Day Smoker  . Smokeless tobacco: Never Used  Substance Use Topics  . Alcohol use: Yes    Comment: occasional  . Drug use: Yes    Types: Methamphetamines, Heroin    Comment: $30-40 daily    Review of Systems  Constitutional: Negative for fever. Respiratory: Negative for cough or shortness of breath.  Musculoskeletal: Negative for myalgias Skin: Positive for laceration Neurological: Negative for numbness or paresthesias. ____________________________________________   PHYSICAL EXAM:  VITAL SIGNS: ED Triage Vitals  Enc Vitals Group     BP 10/14/19 1214 132/82     Pulse Rate 10/14/19 1214 67     Resp  10/14/19 1214 18     Temp 10/14/19 1214 98 F (36.7 C)     Temp Source 10/14/19 1214 Oral     SpO2 10/14/19 1214 97 %     Weight 10/14/19 0933 155 lb (70.3 kg)     Height 10/14/19 0933 5\' 10"  (1.778 m)     Head Circumference --      Peak Flow --      Pain Score 10/14/19 0933 6     Pain Loc --      Pain Edu? --      Excl. in [place]? --      Constitutional: Well appearing. Eyes: Conjunctivae are clear without discharge or drainage. Nose: No rhinorrhea noted. Mouth/Throat: Airway is patent.  Neck: No stridor. Unrestricted range of motion observed. Cardiovascular: Capillary refill is <3 seconds.  Respiratory: Respirations are even and unlabored.. Musculoskeletal: Unrestricted range of motion observed. Neurologic: Awake, alert, and oriented x 4.  Skin: 2 cm flap-like laceration between the fourth and fifth digits of the left hand.  ____________________________________________   LABS (all labs ordered are listed, but only abnormal results are displayed)  Labs Reviewed - No data to display ____________________________________________  EKG  Not indicated. ____________________________________________  RADIOLOGY  Not indicated ____________________________________________   PROCEDURES  .Marland KitchenLaceration Repair  Date/Time: 10/14/2019 1:40 PM Performed by: Victorino Dike, FNP Authorized by: Victorino Dike, FNP   Consent:    Consent obtained:  Verbal   Consent given by:  Patient   Risks discussed:  Infection and poor wound healing Anesthesia (see MAR for exact dosages):    Anesthesia method:  None Laceration details:    Location:  Hand   Hand location: Webspace between fourth and fifth digits. Repair type:    Repair type:  Simple Treatment:    Area cleansed with:  Hibiclens and saline   Amount of cleaning:  Standard   Irrigation method:  Tap Skin repair:    Repair method:  Tissue adhesive Approximation:    Approximation:  Close Post-procedure details:     Dressing:  Sterile dressing (Buddy tape)   ____________________________________________   INITIAL IMPRESSION / ASSESSMENT AND PLAN / ED COURSE  Jeffrey NIDIFFER is a 24 y.o. male presents to the emergency department several hours after sustaining laceration to his left hand while doing dishes.  See HPI for further details.  No active bleeding from the laceration which is already pretty well approximated.  Area covered with Dermabond.  After Dermabond dried 2 x 2 was inserted between the ring and pinky finger than the 2 were buddy taped.  Wound care instructions were discussed.  Patient advised to return to the emergency department for any concern of infection.   Medications - No data to display   Pertinent labs & imaging results that were available during my care of the patient were reviewed by me and considered in my medical decision making (see chart for details).  ____________________________________________   FINAL CLINICAL IMPRESSION(S) / ED DIAGNOSES  Final diagnoses:  Laceration of left hand without foreign body, initial encounter    ED Discharge Orders    None       Note:  This document was prepared using Dragon voice recognition software and may include unintentional dictation errors.   Chinita Pester, FNP 10/14/19 1343    Shaune Pollack, MD 10/15/19 2032

## 2019-10-14 NOTE — ED Notes (Signed)
Pt states he cut his right hand between the 3-4th digit while washing dishes and a glass broke and cut him yesterday. No bleeding noted on arrival

## 2019-10-14 NOTE — ED Notes (Signed)
Pt called from lobby with no reply. Unable to locate pt.  

## 2019-10-14 NOTE — ED Triage Notes (Signed)
Pt cut hand on glass last night. Came in but left before seen. Laceration between 4th and 5th digits right hand. Bleeding controlled. Unknown last Tdap

## 2019-11-24 ENCOUNTER — Other Ambulatory Visit: Payer: Self-pay

## 2019-11-24 ENCOUNTER — Emergency Department
Admission: EM | Admit: 2019-11-24 | Discharge: 2019-11-26 | Disposition: A | Discharge status: Home / Self Care | Payer: Self-pay | Attending: Emergency Medicine | Admitting: Emergency Medicine

## 2019-11-24 ENCOUNTER — Encounter: Payer: Self-pay | Admitting: Emergency Medicine

## 2019-11-24 DIAGNOSIS — F17200 Nicotine dependence, unspecified, uncomplicated: Secondary | ICD-10-CM | POA: Insufficient documentation

## 2019-11-24 DIAGNOSIS — Z20822 Contact with and (suspected) exposure to covid-19: Secondary | ICD-10-CM | POA: Insufficient documentation

## 2019-11-24 DIAGNOSIS — F1910 Other psychoactive substance abuse, uncomplicated: Secondary | ICD-10-CM | POA: Insufficient documentation

## 2019-11-24 HISTORY — DX: Other psychoactive substance use, unspecified, uncomplicated: F19.90

## 2019-11-24 LAB — CBC
HCT: 42.1 % (ref 39.0–52.0)
Hemoglobin: 14.8 g/dL (ref 13.0–17.0)
MCH: 32.7 pg (ref 26.0–34.0)
MCHC: 35.2 g/dL (ref 30.0–36.0)
MCV: 92.9 fL (ref 80.0–100.0)
Platelets: 250 10*3/uL (ref 150–400)
RBC: 4.53 MIL/uL (ref 4.22–5.81)
RDW: 12.8 % (ref 11.5–15.5)
WBC: 11.3 10*3/uL — ABNORMAL HIGH (ref 4.0–10.5)
nRBC: 0 % (ref 0.0–0.2)

## 2019-11-24 LAB — COMPREHENSIVE METABOLIC PANEL
ALT: 35 U/L (ref 0–44)
AST: 56 U/L — ABNORMAL HIGH (ref 15–41)
Albumin: 4.8 g/dL (ref 3.5–5.0)
Alkaline Phosphatase: 66 U/L (ref 38–126)
Anion gap: 11 (ref 5–15)
BUN: 12 mg/dL (ref 6–20)
CO2: 24 mmol/L (ref 22–32)
Calcium: 9.4 mg/dL (ref 8.9–10.3)
Chloride: 104 mmol/L (ref 98–111)
Creatinine, Ser: 1.08 mg/dL (ref 0.61–1.24)
GFR calc Af Amer: >60 mL/min (ref >60)
GFR calc non Af Amer: >60 mL/min (ref >60)
Glucose, Bld: 125 mg/dL — ABNORMAL HIGH (ref 70–99)
Potassium: 3.7 mmol/L (ref 3.5–5.1)
Sodium: 139 mmol/L (ref 135–145)
Total Bilirubin: 1.4 mg/dL — ABNORMAL HIGH (ref 0.3–1.2)
Total Protein: 7.8 g/dL (ref 6.5–8.1)

## 2019-11-24 LAB — URINE DRUG SCREEN, QUALITATIVE (ARMC ONLY)
Amphetamines, Ur Screen: POSITIVE — AB
Barbiturates, Ur Screen: NOT DETECTED
Benzodiazepine, Ur Scrn: POSITIVE — AB
Cannabinoid 50 Ng, Ur ~~LOC~~: POSITIVE — AB
Cocaine Metabolite,Ur ~~LOC~~: NOT DETECTED
MDMA (Ecstasy)Ur Screen: NOT DETECTED
Methadone Scn, Ur: NOT DETECTED
Opiate, Ur Screen: NOT DETECTED
Phencyclidine (PCP) Ur S: NOT DETECTED
Tricyclic, Ur Screen: NOT DETECTED

## 2019-11-24 LAB — SARS CORONAVIRUS 2 BY RT PCR (HOSPITAL ORDER, PERFORMED IN ~~LOC~~ HOSPITAL LAB): SARS Coronavirus 2: NEGATIVE

## 2019-11-24 LAB — ACETAMINOPHEN LEVEL: Acetaminophen (Tylenol), Serum: <10 ug/mL — ABNORMAL LOW (ref 10–30)

## 2019-11-24 LAB — ETHANOL: Alcohol, Ethyl (B): <10 mg/dL (ref <10)

## 2019-11-24 LAB — SALICYLATE LEVEL: Salicylate Lvl: <7 mg/dL — ABNORMAL LOW (ref 7.0–30.0)

## 2019-11-24 NOTE — ED Notes (Signed)
Pt given lunch tray.

## 2019-11-24 NOTE — ED Notes (Signed)
Up to bathroom to attempt to provide urine sample.  

## 2019-11-24 NOTE — ED Notes (Signed)
Pt ate breakfast tray.

## 2019-11-24 NOTE — ED Notes (Signed)
Pt given phone to call grandmother as requested by grandmother.

## 2019-11-24 NOTE — ED Notes (Signed)
TTS at bedside. 

## 2019-11-24 NOTE — BH Assessment (Addendum)
Referral information for Detox Treatment faxed to;   [facility] [phone] reported to call back at 9am, potential bed may be available   [facility] [phone] or [phone])

## 2019-11-24 NOTE — ED Triage Notes (Signed)
Pt reports needs detox from meth and heroin. Last used meth yesterday and heroin 2 days ago. Pt states that he has lost his family because of it and feels depressed and like he is losing his mind and needs to talk to someone as well. Pt denies SI/HI. Pt reports has been using any and everything since he was 71

## 2019-11-24 NOTE — ED Notes (Signed)
Denies SI/HI

## 2019-11-24 NOTE — ED Notes (Signed)
Pt given meal tray and a soda.  

## 2019-11-24 NOTE — ED Notes (Signed)
VOLUNTARY/awaiting re-evaluation

## 2019-11-24 NOTE — ED Notes (Signed)
Pt transferred into ED BHU room 3  Patient assigned to appropriate care area. Patient oriented to unit/care area: Informed that, for his safety, care areas are designed for safety and monitored by security cameras at all times; Visiting hours and phone times explained to patient. Patient verbalizes understanding, and verbal contract for safety obtained.   Assessment completed  He is voluntary  He denies pain

## 2019-11-24 NOTE — ED Notes (Signed)
Dressed out by this [employer] fundraiser. Black shoes, socks, brown pants, blue sweatshirt, black tank top and green/black drawstring bag placed into labeled belonging bags.

## 2019-11-24 NOTE — ED Provider Notes (Signed)
[facility] Emergency Department Provider Note   ____________________________________________    I have reviewed the triage vital signs and the nursing notes.   HISTORY  Chief Complaint Request detox    HPI Jeffrey Patel is a 24 y.o. male with a history of drug abuse who requests help with detox.  Patient reports that he will do "just about anything "and has a long history of drug abuse.  He does report that he injects heroin.  Denies physical complaints.  No chest pain.  No fevers or chills or cough or shortness of breath.  Reports that his girlfriend recently left him and that he has lost his family because of substance abuse.  He denies suicidality but does report depression.  Past Medical History:  Diagnosis Date  . IV drug user   . Left ankle injury     There are no problems to display for this patient.   History reviewed. No pertinent surgical history.  Prior to Admission medications   Medication Sig Start Date End Date Taking? Authorizing Provider  ibuprofen (ADVIL,MOTRIN) 600 MG tablet Take 1 tablet (600 mg total) by mouth every 8 (eight) hours as needed. 09/19/17   Tommi Rumps, PA-C  penicillin v potassium (VEETID) 500 MG tablet Take 1 tablet (500 mg total) by mouth 4 (four) times daily. 09/19/17   Tommi Rumps, PA-C     Allergies Patient has no known allergies.  No family history on file.  Social History Social History   Tobacco Use  . Smoking status: Current Every Day Smoker  . Smokeless tobacco: Never Used  Substance Use Topics  . Alcohol use: Yes    Comment: occasional  . Drug use: Yes    Types: Methamphetamines, Heroin    Comment: $30-40 daily    Review of Systems  Constitutional: No fevers Eyes: No visual changes.  ENT: No sore throat. Cardiovascular: No chest pain Respiratory: Denies shortness of breath. Gastrointestinal: No vomiting Genitourinary: Negative for dysuria. Musculoskeletal: Negative  for back pain. Skin: No rash or abscess Neurological: Negative for headaches    ____________________________________________   PHYSICAL EXAM:  VITAL SIGNS: ED Triage Vitals  Enc Vitals Group     BP 11/24/19 0755 (!) 135/94     Pulse Rate 11/24/19 0755 100     Resp 11/24/19 0755 20     Temp 11/24/19 0755 99 F (37.2 C)     Temp Source 11/24/19 0755 Oral     SpO2 11/24/19 0755 96 %     Weight 11/24/19 0751 68 kg (150 lb)     Height 11/24/19 0751 1.778 m (5\' 10" )     Head Circumference --      Peak Flow --      Pain Score 11/24/19 0751 0     Pain Loc --      Pain Edu? --      Excl. in [place]? --     Constitutional: Alert and oriented  Nose: No congestion/rhinnorhea. Mouth/Throat: Mucous membranes are moist.    Cardiovascular: Normal rate, regular rhythm.  Good peripheral circulation. Respiratory: Normal respiratory effort.  No retractions.  Gastrointestinal: Soft and nontender. No distention.    Musculoskeletal: No lower extremity tenderness nor edema.  Warm and well perfused Neurologic:  Normal speech and language. No gross focal neurologic deficits are appreciated.  Skin:  Skin is warm, dry and intact. No rash noted. Psychiatric: Mood and affect are normal. Speech and behavior are normal.  ____________________________________________  LABS (all labs ordered are listed, but only abnormal results are displayed)  Labs Reviewed  COMPREHENSIVE METABOLIC PANEL - Abnormal; Notable for the following components:      Result Value   Glucose, Bld 125 (*)    AST 56 (*)    Total Bilirubin 1.4 (*)    All other components within normal limits  SALICYLATE LEVEL - Abnormal; Notable for the following components:   Salicylate Lvl <7.0 (*)    All other components within normal limits  ACETAMINOPHEN LEVEL - Abnormal; Notable for the following components:   Acetaminophen (Tylenol), Serum <10 (*)    All other components within normal limits  CBC - Abnormal; Notable for the  following components:   WBC 11.3 (*)    All other components within normal limits  URINE DRUG SCREEN, QUALITATIVE ([facility] ONLY) - Abnormal; Notable for the following components:   Amphetamines, Ur Screen POSITIVE (*)    Cannabinoid 50 Ng, Ur [place] POSITIVE (*)    Benzodiazepine, Ur Scrn POSITIVE (*)    All other components within normal limits  ETHANOL   ____________________________________________  EKG  None ____________________________________________  RADIOLOGY  None ____________________________________________   PROCEDURES  Procedure(s) performed: No  Procedures   Critical Care performed: No ____________________________________________   INITIAL IMPRESSION / ASSESSMENT AND PLAN / ED COURSE  Pertinent labs & imaging results that were available during my care of the patient were reviewed by me and considered in my medical decision making (see chart for details).  Patient well-appearing and in no acute distress.  Vital signs are reassuring, exam is unremarkable.  I have consulted TTS for drug detox resources.  No SI or HI, pending labs.    ____________________________________________   FINAL CLINICAL IMPRESSION(S) / ED DIAGNOSES  Final diagnoses:  Polysubstance abuse (HCC)        Note:  This document was prepared using Dragon voice recognition software and may include unintentional dictation errors.   Jene Every, MD 11/24/19 1304

## 2019-11-24 NOTE — BH Assessment (Signed)
Assessment Note  Jeffrey Patel is an 24 y.o. male who presents to [facility] ED voluntarily for treatment. 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 Last used meth yesterday and heroin 2 days ago. Pt states that he has lost his family because of it and feels depressed and like he is losing his mind and needs to talk to someone as well. Pt denies SI/HI. Pt reports has been using any and everything since he was 16.  During TTS assessment pt presents alert and oriented x 3, restless but cooperative, and mood-congruent with affect. The pt does appear to be responding to internal and external stimuli. Neither is the pt presenting with any delusional thinking. Pt confirmed the information provided to the triage RN and identified his main compliant to be detox. Pt identified his current substance choice to be heroine and meth but stated "I will use anything I can get high from". Pt reports to be unsure of when his SA started but reports struggles with SA for years. Pt reports to need detox in order to get back with his family (girlfriend, 3 children) and stated "because without it I will never get them back".  Pt denies experiencing any withdrawal symptoms and reports struggles with eating (last time 2 days ago) and sleeping (4-5 hrs/night). Pt denies a sobriety period or MH/ INPT/OPT hx. Pt denies a family hx of MH/SA. Pt denies any current SI/HI/AH/VH but was unable to contract for safety stating "I can't leave I need help". Pt provided his girlfriend Jeffrey Patel [phone]) as a collateral contact.   TTS attempted to contact [facility] but was unsuccessful and was unable to leave a HIPPA compliant voicemail.   Pt is recommended for overnight observation   Diagnosis: per hx polysubstance abuse    Past Medical History:  Past Medical History:  Diagnosis Date  . IV drug user   . Left ankle injury     History reviewed. No pertinent surgical history.  Family History: No family history on  file.  Social History:  reports that he has been smoking. He has never used smokeless tobacco. He reports current alcohol use. He reports current drug use. Drugs: Methamphetamines and Heroin.  Additional Social History:  Alcohol / Drug Use Pain Medications: see mar Prescriptions: see mar Over the Counter: see mar History of alcohol / drug use?: Yes Substance #1 Name of Substance 1: Xanax Substance #2 Name of Substance 2: Meth  CIWA: CIWA-Ar BP: (!) 135/94 Pulse Rate: 100 COWS:    Allergies: No Known Allergies  Home Medications: (Not in a hospital admission)   OB/GYN Status:  No LMP for male patient.  General Assessment Data Location of Assessment: [facility] ED TTS Assessment: In system Is this a Tele or Face-to-Face Assessment?: Face-to-Face Is this an Initial Assessment or a Re-assessment for this encounter?: Initial Assessment Patient Accompanied by:: N/A Language Other than English: No Living Arrangements: Other (Comment) (Private home) What gender do you identify as?: Male Marital status: Single Maiden name: n/a Pregnancy Status: No Living Arrangements: Spouse/significant other Can pt return to current living arrangement?: Yes Admission Status: Voluntary Is patient capable of signing voluntary admission?: Yes Referral Source: Self/Family/Friend Insurance type: None  Medical Screening Exam [facility] ONLY) Medical Exam completed: Yes  Crisis Care Plan Living Arrangements: Spouse/significant other Legal Guardian:  (self ) Name of Psychiatrist: None reported  Name of Therapist: None reported   Education Status Is patient currently in school?: No Is the patient employed, unemployed or  receiving disability?: Unemployed  Risk to self with the past 6 months Suicidal Ideation: No Has patient been a risk to self within the past 6 months prior to admission? : No Suicidal Intent: No Has patient had any suicidal intent within the past 6 months prior to admission? :  No Is patient at risk for suicide?: No Suicidal Plan?: No Has patient had any suicidal plan within the past 6 months prior to admission? : No Access to Means: No What has been your use of drugs/alcohol within the last 12 months?: Meth, xanax  Previous Attempts/Gestures: No How many times?: 0 Other Self Harm Risks: None reported  Triggers for Past Attempts: None known Intentional Self Injurious Behavior: None Family Suicide History: No Recent stressful life event(s): Other (Comment) (family struggles ) Persecutory voices/beliefs?: No Depression: Yes Depression Symptoms: Insomnia Substance abuse history and/or treatment for substance abuse?: No Suicide prevention information given to non-admitted patients: Not applicable  Risk to Others within the past 6 months Homicidal Ideation: No Does patient have any lifetime risk of violence toward others beyond the six months prior to admission? : No Thoughts of Harm to Others: No Current Homicidal Intent: No Current Homicidal Plan: No Access to Homicidal Means: No Identified Victim: n/a History of harm to others?: No Assessment of Violence: None Noted Violent Behavior Description: n/a Does patient have access to weapons?: No Criminal Charges Pending?: No Does patient have a court date: No Is patient on probation?: No  Psychosis Hallucinations: None noted Delusions: None noted  Mental Status Report Appearance/Hygiene: Unremarkable Eye Contact: Good Motor Activity: Freedom of movement Speech: Logical/coherent Level of Consciousness: Alert, Restless Mood: Anxious, Depressed Affect: Anxious, Depressed Anxiety Level: Moderate Thought Processes: Coherent, Relevant Judgement: Partial Orientation: Appropriate for developmental age Obsessive Compulsive Thoughts/Behaviors: None  Cognitive Functioning Concentration: Good Memory: Recent Intact, Remote Intact Is patient IDD: No Insight: Fair Impulse Control: Good Appetite: Fair (pt  reports to be eating sometimes) Have you had any weight changes? : No Change Sleep: Decreased Total Hours of Sleep:  (4-5 hrs) Vegetative Symptoms: None     Prior Inpatient Therapy Prior Inpatient Therapy: No  Prior Outpatient Therapy Prior Outpatient Therapy: No Does patient have an ACCT team?: No Does patient have Intensive In-House Services?  : No Does patient have [facility] services? : Unknown Does patient have P4CC services?: Unknown  ADL Screening (condition at time of admission) Is the patient deaf or have difficulty hearing?: No Does the patient have difficulty seeing, even when wearing glasses/contacts?: No Does the patient have difficulty concentrating, remembering, or making decisions?: No Does the patient have difficulty dressing or bathing?: No Does the patient have difficulty walking or climbing stairs?: No Weakness of Legs: None Weakness of Arms/Hands: None  Home Assistive Devices/Equipment Home Assistive Devices/Equipment: None  Therapy Consults (therapy consults require a physician order) PT Evaluation Needed: No OT Evalulation Needed: No SLP Evaluation Needed: No Abuse/Neglect Assessment (Assessment to be complete while patient is alone) Abuse/Neglect Assessment Can Be Completed: Yes Physical Abuse: Denies Verbal Abuse: Denies Sexual Abuse: Denies Exploitation of patient/patient's resources: Denies Self-Neglect: Denies Values / Beliefs Cultural Requests During Hospitalization: None Spiritual Requests During Hospitalization: None Consults Spiritual Care Consult Needed: No Transition of Care Team Consult Needed: No            Disposition:  Disposition Initial Assessment Completed for this Encounter: Yes Patient referred to: Other (Comment)  On Site Evaluation by:   Reviewed with Physician:    Opal Sidles 11/24/2019 4:00 PM

## 2019-11-25 NOTE — BH Assessment (Signed)
This Clinical research associate contacted [facility] regarding bed availability, staff reports no current beds available.

## 2019-11-25 NOTE — ED Notes (Signed)
VOL  PENDING  PLACEMENT 

## 2019-11-25 NOTE — BH Assessment (Signed)
Per request, this Clinical research associate coordinated a phone interview between [facility] and the pt.

## 2019-11-25 NOTE — ED Notes (Signed)
VOLUNTARY/continues to await placement 

## 2019-11-25 NOTE — ED Notes (Signed)
Pt denies SI/HI/AVH on assessment. Pt referred out for detox treatment

## 2019-11-25 NOTE — ED Notes (Signed)

## 2019-11-25 NOTE — BH Assessment (Signed)
This Clinical research associate reassessed patient, patient denies SI/HI/AH/VH and does not appear to be responding to any internal or external stimuli. Patient was provided with phone number to [facility] as well as outpatient resources to [facility] and [place] [facility], patient was receptive. Writer communicated resources given to patient to EDP Dr. Juliette Alcide for patient discharge.

## 2019-11-25 NOTE — BH Assessment (Signed)
Pt called [facility] to follow up on phone interview and bed status, however there was no answer.

## 2019-11-26 NOTE — Discharge Instructions (Signed)
Please follow-up with the outpatient treatment facilities we have given you.  Please return as needed.

## 2020-09-10 ENCOUNTER — Other Ambulatory Visit: Payer: Self-pay

## 2020-09-10 ENCOUNTER — Emergency Department
Admission: EM | Admit: 2020-09-10 | Discharge: 2020-09-10 | Disposition: A | Discharge status: Home / Self Care | Payer: Self-pay | Attending: Emergency Medicine | Admitting: Emergency Medicine

## 2020-09-10 ENCOUNTER — Emergency Department: Payer: Self-pay

## 2020-09-10 DIAGNOSIS — M25531 Pain in right wrist: Secondary | ICD-10-CM

## 2020-09-10 DIAGNOSIS — S52501A Unspecified fracture of the lower end of right radius, initial encounter for closed fracture: Secondary | ICD-10-CM | POA: Insufficient documentation

## 2020-09-10 DIAGNOSIS — Y990 Civilian activity done for income or pay: Secondary | ICD-10-CM | POA: Insufficient documentation

## 2020-09-10 DIAGNOSIS — S8012XA Contusion of left lower leg, initial encounter: Secondary | ICD-10-CM | POA: Insufficient documentation

## 2020-09-10 DIAGNOSIS — S62394A Other fracture of fourth metacarpal bone, right hand, initial encounter for closed fracture: Secondary | ICD-10-CM

## 2020-09-10 DIAGNOSIS — Y92410 Unspecified street and highway as the place of occurrence of the external cause: Secondary | ICD-10-CM | POA: Diagnosis not present

## 2020-09-10 DIAGNOSIS — F17200 Nicotine dependence, unspecified, uncomplicated: Secondary | ICD-10-CM | POA: Insufficient documentation

## 2020-09-10 DIAGNOSIS — S80812A Abrasion, left lower leg, initial encounter: Secondary | ICD-10-CM

## 2020-09-10 DIAGNOSIS — S62354A Nondisplaced fracture of shaft of fourth metacarpal bone, right hand, initial encounter for closed fracture: Secondary | ICD-10-CM | POA: Insufficient documentation

## 2020-09-10 DIAGNOSIS — S6991XA Unspecified injury of right wrist, hand and finger(s), initial encounter: Secondary | ICD-10-CM | POA: Diagnosis present

## 2020-09-10 MED ORDER — IBUPROFEN 800 MG PO TABS: 800.0000 mg | ORAL_TABLET | Freq: Three times a day (TID) | ORAL | 0 refills | Status: AC | PRN

## 2020-09-10 NOTE — ED Notes (Signed)
Attempted to call pt's employer, [employer] at the number provided by the patient [phone]) to inquire about the need for a urine drug screen. No answer, provider aware.
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
## 2020-09-10 NOTE — ED Notes (Signed)
Morgan NT to complete splint placement soon.
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
## 2020-10-26 DEATH — deceased
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
